# Patient Record
Sex: Male | Born: 2000 | Race: White | Hispanic: No | Marital: Single | Smoking: Never smoker
Health system: Southern US, Community
[De-identification: ages and names within clinical notes are randomized; demographics above are authoritative.]

## PROBLEM LIST (undated history)

## (undated) DIAGNOSIS — R011 Cardiac murmur, unspecified: Secondary | ICD-10-CM

## (undated) DIAGNOSIS — J020 Streptococcal pharyngitis: Secondary | ICD-10-CM

## (undated) DIAGNOSIS — R197 Diarrhea, unspecified: Secondary | ICD-10-CM

## (undated) DIAGNOSIS — R51 Headache: Secondary | ICD-10-CM

## (undated) HISTORY — PX: OTHER SURGICAL HISTORY: SHX169

## (undated) HISTORY — PX: CIRCUMCISION: SUR203

---
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
## 2022-03-27 ENCOUNTER — Ambulatory Visit
Admission: EM | Admit: 2022-03-27 | Discharge: 2022-03-27 | Disposition: A | Discharge status: Home / Self Care | Payer: BC Managed Care – PPO | Attending: Urgent Care | Admitting: Urgent Care

## 2022-03-27 ENCOUNTER — Encounter: Payer: Self-pay | Admitting: Emergency Medicine

## 2022-03-27 DIAGNOSIS — J309 Allergic rhinitis, unspecified: Secondary | ICD-10-CM

## 2022-03-27 DIAGNOSIS — J019 Acute sinusitis, unspecified: Secondary | ICD-10-CM | POA: Diagnosis not present

## 2022-03-27 MED ORDER — PSEUDOEPHEDRINE HCL 60 MG PO TABS: 60.0000 mg | ORAL_TABLET | Freq: Three times a day (TID) | ORAL | 0 refills | Status: DC | PRN

## 2022-03-27 MED ORDER — DOXYCYCLINE HYCLATE 100 MG PO CAPS: 100.0000 mg | ORAL_CAPSULE | Freq: Two times a day (BID) | ORAL | 0 refills | Status: DC

## 2022-03-27 MED ORDER — PREDNISONE 10 MG PO TABS: 30.0000 mg | ORAL_TABLET | Freq: Every day | ORAL | 0 refills | Status: DC

## 2022-03-27 MED ORDER — LEVOCETIRIZINE DIHYDROCHLORIDE 5 MG PO TABS: 5.0000 mg | ORAL_TABLET | Freq: Every evening | ORAL | 0 refills | Status: AC

## 2022-03-27 NOTE — ED Provider Notes (Signed)
?[place] ? ? ?MRN: [id number] DOB: 03-08-2001 ? ?Subjective:  ? ?Alexander Mckinney is a 21 y.o. male presenting for 2-week history of persistent sinus congestion, postnasal drainage, coughing now having throat pain.  Patient has a history of allergic rhinitis, has been using his Flonase without resolution of his symptoms.  No chest pain, fevers, shortness of breath or wheezing.  No history of asthma.  Patient is non-smoker. ? ?No current facility-administered medications for this encounter. ? ?Current Outpatient Medications:  ?  acetaminophen (TYLENOL) 325 MG tablet, Take 2 tablets (650 mg total) by mouth every 6 (six) hours as needed for mild pain., Disp: 15 tablet, Rfl: 0 ?  fluticasone (FLONASE) 50 MCG/ACT nasal spray, Place 1 spray into both nostrils at bedtime., Disp: , Rfl:  ?  HYDROCHLOROTHIAZIDE PO, Take by mouth., Disp: , Rfl:  ?  ibuprofen (ADVIL,MOTRIN) 100 MG/5ML suspension, Take 100 mg by mouth every 6 (six) hours as needed for fever., Disp: , Rfl:  ?  LOSARTAN POTASSIUM PO, Take by mouth., Disp: , Rfl:  ?  Pediatric Multiple Vit-C-FA (PEDIATRIC MULTIVITAMIN) chewable tablet, Chew 1 tablet by mouth daily., Disp: , Rfl:   ? ?Allergies  ?Allergen Reactions  ? Azithromycin Hives  ? ? ?Past Medical History:  ?Diagnosis Date  ? Diarrhea   ? recently with the flu-had flu 10/14/12  ? Headache(784.0)   ? occasionally with flu  ? 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 Strep throat   ?  ? ?Past Surgical History:  ?Procedure Laterality Date  ? CIRCUMCISION    ? drain placed    ? in abdomen to drain infection  ? LAPAROSCOPIC APPENDECTOMY  10/23/2012  ? Procedure: APPENDECTOMY LAPAROSCOPIC;  Surgeon: Jerilynn Mages. Gerald Stabs, MD;  Location: [place];  Service: Pediatrics;  Laterality: N/A;  ? picc line    ? ? ?Family History  ?Problem Relation Age of Onset  ? Hypertension Father   ? Hyperlipidemia Father   ? Hypertension Paternal Uncle   ? Arthritis Maternal Grandmother   ? Birth defects Maternal  Grandmother   ? Cancer Paternal Grandmother   ? Diabetes Paternal Grandfather   ? Heart disease Paternal Grandfather   ? Hypertension Paternal Grandfather   ? Hyperlipidemia Maternal Uncle   ? Hypertension Maternal Uncle   ? Diabetes Maternal Grandfather   ? Hyperlipidemia Maternal Grandfather   ? Hypertension Maternal Grandfather   ? Vision loss Maternal Grandfather   ? ? ?Social History  ? ?Tobacco Use  ? Smoking status: Passive Smoke Exposure - Never Smoker  ? Smokeless tobacco: Never  ?Substance Use Topics  ? Alcohol use: No  ? Drug use: No  ? ? ?ROS ? ? ?Objective:  ? ?Vitals: ?BP (!) 155/94   Pulse 98   Temp 98.4 ?F (36.9 ?C)   Resp 20   SpO2 98%  ? ?Physical Exam ?Constitutional:   ?   General: He is not in acute distress. ?   Appearance: Normal appearance. He is well-developed and normal weight. He is not ill-appearing, toxic-appearing or diaphoretic.  ?HENT:  ?   Head: Normocephalic and atraumatic.  ?   Right Ear: Tympanic membrane, ear canal and external ear normal. There is no impacted cerumen.  ?   Left Ear: Tympanic membrane, ear canal and external ear normal. There is no impacted cerumen.  ?   Nose: Congestion present. No rhinorrhea.  ?   Mouth/Throat:  ?   Mouth: Mucous membranes are moist.  ?  Pharynx: No oropharyngeal exudate or posterior oropharyngeal erythema.  ?   Comments: Cobblestone pattern postnasal drainage overlying pharynx. ?Eyes:  ?   General: No scleral icterus.    ?   Right eye: No discharge.     ?   Left eye: No discharge.  ?   Extraocular Movements: Extraocular movements intact.  ?   Conjunctiva/sclera: Conjunctivae normal.  ?Cardiovascular:  ?   Rate and Rhythm: Normal rate and regular rhythm.  ?   Heart sounds: Normal heart sounds. No murmur heard. ?  No friction rub. No gallop.  ?Pulmonary:  ?   Effort: Pulmonary effort is normal. No respiratory distress.  ?   Breath sounds: Normal breath sounds. No stridor. No wheezing, rhonchi or rales.  ?Musculoskeletal:  ?   Cervical back:  Normal range of motion and neck supple. No rigidity. No muscular tenderness.  ?Neurological:  ?   General: No focal deficit present.  ?   Mental Status: He is alert and oriented to person, place, and time.  ?Psychiatric:     ?   Mood and Affect: Mood normal.     ?   Behavior: Behavior normal.     ?   Thought Content: Thought content normal.  ? ? ?Assessment and Plan :  ? ?PDMP not reviewed this encounter. ? ?1. Acute non-recurrent sinusitis, unspecified location   ?2. Allergic rhinitis, unspecified seasonality, unspecified trigger   ? ? Deferred imaging given clear cardiopulmonary exam, hemodynamically stable vital signs.  Recommended covering for sinusitis with doxycycline given his significant allergies to azithromycin, penicillin and amoxicillin.  We will also treat for the underlying problem, allergic rhinitis with the prednisone course.  Hold Flonase for now.  Start Xyzal daily.  Once prednisone is done, use pseudoephedrine as needed.  Restart Flonase or nasal spray in 1 to 2 weeks once symptoms resolve completely. Counseled patient on potential for adverse effects with medications prescribed/recommended today, ER and return-to-clinic precautions discussed, patient verbalized understanding. ? ?  ?Jaynee Eagles, PA-C ?03/27/22 1244 ? ?

## 2022-03-27 NOTE — ED Triage Notes (Signed)
Pt here with intermittent nasal congestion and cough x 2 weeks.  ?

## 2022-08-11 ENCOUNTER — Ambulatory Visit
Admission: EM | Admit: 2022-08-11 | Discharge: 2022-08-11 | Disposition: A | Discharge status: Home / Self Care | Payer: BC Managed Care – PPO

## 2022-08-11 DIAGNOSIS — L739 Follicular disorder, unspecified: Secondary | ICD-10-CM

## 2022-08-11 NOTE — ED Triage Notes (Signed)
Pt states last night while using a Qtip he noticed white substance and a scant amount  bloody drainage from right ear. The patient states his mom saw a pimple in the ear.

## 2022-08-11 NOTE — ED Provider Notes (Signed)
[facility]    CSN: [id number] Arrival date & time: 08/11/22  1008    HISTORY   Chief Complaint  Patient presents with   Ear Drainage   HPI Alexander Mckinney is a pleasant, 21 y.o. male who presents to urgent care today. Pt states last night while using a Qtip to clean his right ear, he noticed white substance and a scant amount  bloody drainage on the Q-tip. The patient states his mom use a flashlight to take a look, states she told him that she saw a pimple in his ear.   The history is provided by the patient.   Past Medical History:  Diagnosis Date   Diarrhea    recently with the flu-had flu 10/14/12   Headache(784.0)    occasionally with flu   Heart murmur    from birth   Strep throat    Patient Active Problem List   Diagnosis Date Noted   Chronic tonsillar hypertrophy 03/16/2015   Past Surgical History:  Procedure Laterality Date   CIRCUMCISION     drain placed     in abdomen to drain infection   LAPAROSCOPIC APPENDECTOMY  10/23/2012   Procedure: APPENDECTOMY LAPAROSCOPIC;  Surgeon: Judie PetitM. Leonia CoronaShuaib Farooqui, MD;  Location: [facility];  Service: Pediatrics;  Laterality: N/A;   picc line      Home Medications    Prior to Admission medications   Medication Sig Start Date End Date Taking? Authorizing Provider  acetaminophen (TYLENOL) 325 MG tablet Take 2 tablets (650 mg total) by mouth every 6 (six) hours as needed for mild pain. 03/31/15   Marcellina MillinGaley, Timothy, MD  doxycycline (VIBRAMYCIN) 100 MG capsule Take 1 capsule (100 mg total) by mouth 2 (two) times daily. 03/27/22   Wallis BambergMani, Mario, PA-C  fluticasone (FLONASE) 50 MCG/ACT nasal spray Place 1 spray into both nostrils at bedtime.    [provider]  HYDROCHLOROTHIAZIDE PO Take by mouth.    [provider]  ibuprofen (ADVIL,MOTRIN) 100 MG/5ML suspension Take 100 mg by mouth every 6 (six) hours as needed for fever.    [provider]  levocetirizine (XYZAL) 5 MG tablet Take 1 tablet (5 mg total) by  mouth every evening. 03/27/22   Wallis BambergMani, Mario, PA-C  LOSARTAN POTASSIUM PO Take by mouth.    [provider]  Pediatric Multiple Vit-C-FA (PEDIATRIC MULTIVITAMIN) chewable tablet Chew 1 tablet by mouth daily.    [provider]  predniSONE (DELTASONE) 10 MG tablet Take 3 tablets (30 mg total) by mouth daily with breakfast. 03/27/22   Wallis BambergMani, Mario, PA-C  pseudoephedrine (SUDAFED) 60 MG tablet Take 1 tablet (60 mg total) by mouth every 8 (eight) hours as needed for congestion. 03/27/22   Wallis BambergMani, Mario, PA-C    Family History Family History  Problem Relation Age of Onset   Hypertension Father    Hyperlipidemia Father    Hypertension Paternal Uncle    Arthritis Maternal Grandmother    Birth defects Maternal Grandmother    Cancer Paternal Grandmother    Diabetes Paternal Grandfather    Heart disease Paternal Grandfather    Hypertension Paternal Grandfather    Hyperlipidemia Maternal Uncle    Hypertension Maternal Uncle    Diabetes Maternal Grandfather    Hyperlipidemia Maternal Grandfather    Hypertension Maternal Grandfather    Vision loss Maternal Grandfather    Social History Social History   Tobacco Use   Smoking status: Passive Smoke Exposure - Never Smoker   Smokeless tobacco: Never  Substance Use Topics   Alcohol use: No   Drug use: No   Allergies   Amoxicillin, Azithromycin, and Penicillins  Review of Systems Review of Systems Pertinent findings revealed after performing a 14 point review of systems has been noted in the history of present illness.  Physical Exam Triage Vital Signs ED Triage Vitals  Enc Vitals Group     BP 09/16/21 0827 (!) 147/82     Pulse Rate 09/16/21 0827 72     Resp 09/16/21 0827 18     Temp 09/16/21 0827 98.3 F (36.8 C)     Temp Source 09/16/21 0827 Oral     SpO2 09/16/21 0827 98 %     Weight --      Height --      Head Circumference --      Peak Flow --      Pain Score 09/16/21 0826 5     Pain Loc --      Pain Edu? --       Excl. in GC? --   No data found.  Updated Vital Signs BP (!) 150/87 (BP Location: Right Arm)   Pulse 72   Temp 98.4 F (36.9 C) (Oral)   Resp 16   SpO2 97%   Physical Exam Vitals and nursing note reviewed.  Constitutional:      General: He is not in acute distress.    Appearance: Normal appearance. He is not ill-appearing.  HENT:     Head: Normocephalic and atraumatic.     Salivary Glands: Right salivary gland is not diffusely enlarged or tender. Left salivary gland is not diffusely enlarged or tender.     Right Ear: Hearing, tympanic membrane and external ear normal. No drainage. No middle ear effusion. There is no impacted cerumen. Tympanic membrane is not erythematous or bulging.     Left Ear: Hearing, tympanic membrane, ear canal and external ear normal. No drainage.  No middle ear effusion. There is no impacted cerumen. Tympanic membrane is not erythematous or bulging.     Ears:     Comments: Scant amount of bleeding at entrance of right ear canal    Nose: Nose normal. No nasal deformity, septal deviation, mucosal edema, congestion or rhinorrhea.     Right Turbinates: Not enlarged, swollen or pale.     Left Turbinates: Not enlarged, swollen or pale.     Right Sinus: No maxillary sinus tenderness or frontal sinus tenderness.     Left Sinus: No maxillary sinus tenderness or frontal sinus tenderness.     Mouth/Throat:     Lips: Pink. No lesions.     Mouth: Mucous membranes are moist. No oral lesions.     Pharynx: Oropharynx is clear. Uvula midline. No posterior oropharyngeal erythema or uvula swelling.     Tonsils: No tonsillar exudate. 0 on the right. 0 on the left.  Eyes:     General: Lids are normal.        Right eye: No discharge.        Left eye: No discharge.     Extraocular Movements: Extraocular movements intact.     Conjunctiva/sclera: Conjunctivae normal.     Right eye: Right conjunctiva is not injected.     Left eye: Left conjunctiva is not injected.  Neck:      Trachea: Trachea and phonation normal.  Cardiovascular:     Rate and Rhythm: Normal rate and regular rhythm.     Pulses: Normal pulses.  Heart sounds: Normal heart sounds. No murmur heard.    No friction rub. No gallop.  Pulmonary:     Effort: Pulmonary effort is normal. No accessory muscle usage, prolonged expiration or respiratory distress.     Breath sounds: Normal breath sounds. No stridor, decreased air movement or transmitted upper airway sounds. No decreased breath sounds, wheezing, rhonchi or rales.  Chest:     Chest wall: No tenderness.  Musculoskeletal:        General: Normal range of motion.     Cervical back: Normal range of motion and neck supple. Normal range of motion.  Lymphadenopathy:     Cervical: No cervical adenopathy.  Skin:    General: Skin is warm and dry.     Findings: No erythema or rash.  Neurological:     General: No focal deficit present.     Mental Status: He is alert and oriented to person, place, and time.  Psychiatric:        Mood and Affect: Mood normal.        Behavior: Behavior normal.     Visual Acuity Right Eye Distance:   Left Eye Distance:   Bilateral Distance:    Right Eye Near:   Left Eye Near:    Bilateral Near:     UC Couse / Diagnostics / Procedures:     Radiology No results found.  Procedures Procedures (including critical care time) EKG  Pending results:  Labs Reviewed - No data to display  Medications Ordered in UC: Medications - No data to display  UC Diagnoses / Final Clinical Impressions(s)   I have reviewed the triage vital signs and the nursing notes.  Pertinent labs & imaging results that were available during my care of the patient were reviewed by me and considered in my medical decision making (see chart for details).    Final diagnoses:  Folliculitis   Patient advised to not use any earplugs for a few days until the lesion heals.  ED Prescriptions   None    PDMP not reviewed this  encounter.  Disposition Upon Discharge:  Condition: stable for discharge home Home: take medications as prescribed; routine discharge instructions as discussed; follow up as advised.  Patient presented with an acute illness with associated systemic symptoms and significant discomfort requiring urgent management. In my opinion, this is a condition that a prudent lay person (someone who possesses an average knowledge of health and medicine) may potentially expect to result in complications if not addressed urgently such as respiratory distress, impairment of bodily function or dysfunction of bodily organs.   Routine symptom specific, illness specific and/or disease specific instructions were discussed with the patient and/or caregiver at length.   As such, the patient has been evaluated and assessed, work-up was performed and treatment was provided in alignment with urgent care protocols and evidence based medicine.  Patient/parent/caregiver has been advised that the patient may require follow up for further testing and treatment if the symptoms continue in spite of treatment, as clinically indicated and appropriate.  If the patient was tested for COVID-19, Influenza and/or RSV, then the patient/parent/guardian was advised to isolate at home pending the results of his/her diagnostic coronavirus test and potentially longer if they're positive. I have also advised pt that if his/her COVID-19 test returns positive, it's recommended to self-isolate for at least 10 days after symptoms first appeared AND until fever-free for 24 hours without fever reducer AND other symptoms have improved or resolved. Discussed self-isolation recommendations as  well as instructions for household member/close contacts as per the CDC and [place] DHHS, and also gave patient the [place] packet with this information.  Patient/parent/caregiver has been advised to return to the [facility] or PCP in 3-5 days if no better; to PCP or the Emergency  Department if new signs and symptoms develop, or if the current signs or symptoms continue to change or worsen for further workup, evaluation and treatment as clinically indicated and appropriate  The patient will follow up with their current PCP if and as advised. If the patient does not currently have a PCP we will assist them in obtaining one.   The patient may need specialty follow up if the symptoms continue, in spite of conservative treatment and management, for further workup, evaluation, consultation and treatment as clinically indicated and appropriate.  Patient/parent/caregiver verbalized understanding and agreement of plan as discussed.  All questions were addressed during visit.  Please see discharge instructions below for further details of plan.  Discharge Instructions:   Discharge Instructions      Please avoid using any earplugs in your ear canals till the lesion in your right ear canal has healed.      This office note has been dictated using Museum/gallery curator.  Unfortunately, this method of dictation can sometimes lead to typographical or grammatical errors.  I apologize for your inconvenience in advance if this occurs.  Please do not hesitate to reach out to me if clarification is needed.      Lynden Oxford Scales, PA-C 08/11/22 1154

## 2022-08-11 NOTE — Discharge Instructions (Signed)
Please avoid using any earplugs in your ear canals till the lesion in your right ear canal has healed.

## 2022-10-24 ENCOUNTER — Ambulatory Visit
Admission: EM | Admit: 2022-10-24 | Discharge: 2022-10-24 | Disposition: A | Discharge status: Home / Self Care | Payer: BC Managed Care – PPO | Attending: Urgent Care | Admitting: Urgent Care

## 2022-10-24 DIAGNOSIS — R0982 Postnasal drip: Secondary | ICD-10-CM

## 2022-10-24 DIAGNOSIS — J309 Allergic rhinitis, unspecified: Secondary | ICD-10-CM | POA: Diagnosis not present

## 2022-10-24 DIAGNOSIS — J988 Other specified respiratory disorders: Secondary | ICD-10-CM

## 2022-10-24 DIAGNOSIS — B9789 Other viral agents as the cause of diseases classified elsewhere: Secondary | ICD-10-CM | POA: Diagnosis not present

## 2022-10-24 MED ORDER — CETIRIZINE HCL 10 MG PO TABS: 10.0000 mg | ORAL_TABLET | Freq: Every day | ORAL | 0 refills | Status: AC

## 2022-10-24 MED ORDER — PSEUDOEPHEDRINE HCL 60 MG PO TABS: 60.0000 mg | ORAL_TABLET | Freq: Three times a day (TID) | ORAL | 0 refills | Status: AC | PRN

## 2022-10-24 MED ORDER — IPRATROPIUM BROMIDE 0.03 % NA SOLN: 2.0000 | Freq: Two times a day (BID) | NASAL | 0 refills | Status: AC

## 2022-10-24 NOTE — Discharge Instructions (Addendum)
We will manage this as a viral illness. For sore throat or cough try using a honey-based tea. Use 3 teaspoons of honey with juice squeezed from half lemon. Place shaved pieces of ginger into 1/2-1 cup of water and warm over stove top. Then mix the ingredients and repeat every 4 hours as needed. Please take ibuprofen 600mg  every 6 hours with food alternating with OR taken together with Tylenol 500mg -650mg  every 6 hours for throat pain, fevers, aches and pains. Hydrate very well with at least 2 liters of water. Eat light meals such as soups (chicken and noodles, vegetable, chicken and wild rice).  Do not eat foods that you are allergic to.  Taking an antihistamine like Zyrtec can help against postnasal drainage, sinus congestion which can cause sinus pain, sinus headaches, throat pain, painful swallowing, coughing.  You can take this together with pseudoephedrine (Sudafed) at a dose of 60 mg 3 times a day or twice daily as needed for the same kind of nasal drip, congestion.  However, limit your use of pseudoephedrine if you have high blood pressure or avoid altogether if you have abnormal heart rhythms, heart condition.

## 2022-10-24 NOTE — ED Triage Notes (Signed)
Pt c/o nasal congestion started yesterday-NAD-steady gait

## 2022-10-24 NOTE — ED Provider Notes (Signed)
[facility]  Note:  This document was prepared using Conservation officer, historic buildings and may include unintentional dictation errors.  MRN: [id number] DOB: 18-Sep-2001  Subjective:   Alexander Mckinney is a 21 y.o. male presenting for 1 day history of acute onset sinus congestion, post-nasal drainage. Has a history of recurrent sinus infections. Has had strep throat infections, history of tonsillectomy. No concerns for strep or COVID, patient declines testing. Has a history of allergic rhinitis.   No current facility-administered medications for this encounter.  Current Outpatient Medications:    acetaminophen (TYLENOL) 325 MG tablet, Take 2 tablets (650 mg total) by mouth every 6 (six) hours as needed for mild pain., Disp: 15 tablet, Rfl: 0   fluticasone (FLONASE) 50 MCG/ACT nasal spray, Place 1 spray into both nostrils at bedtime., Disp: , Rfl:    ibuprofen (ADVIL,MOTRIN) 100 MG/5ML suspension, Take 100 mg by mouth every 6 (six) hours as needed for fever., Disp: , Rfl:    levocetirizine (XYZAL) 5 MG tablet, Take 1 tablet (5 mg total) by mouth every evening., Disp: 90 tablet, Rfl: 0   Pediatric Multiple Vit-C-FA (PEDIATRIC MULTIVITAMIN) chewable tablet, Chew 1 tablet by mouth daily., Disp: , Rfl:    Allergies  Allergen Reactions   Amoxicillin Rash   Azithromycin Hives   Penicillins Hives    Past Medical History:  Diagnosis Date   Diarrhea    recently with the flu-had flu 10/14/12   Headache(784.0)    occasionally with flu   Heart murmur    from birth   Strep throat      Past Surgical History:  Procedure Laterality Date   CIRCUMCISION     drain placed     in abdomen to drain infection   LAPAROSCOPIC APPENDECTOMY  10/23/2012   Procedure: APPENDECTOMY LAPAROSCOPIC;  Surgeon: Judie Petit. Leonia Corona, MD;  Location: [facility];  Service: Pediatrics;  Laterality: N/A;   picc line      Family History  Problem Relation Age of Onset   Hypertension Father     Hyperlipidemia Father    Hypertension Paternal Uncle    Arthritis Maternal Grandmother    Birth defects Maternal Grandmother    Cancer Paternal Grandmother    Diabetes Paternal Grandfather    Heart disease Paternal Grandfather    Hypertension Paternal Grandfather    Hyperlipidemia Maternal Uncle    Hypertension Maternal Uncle    Diabetes Maternal Grandfather    Hyperlipidemia Maternal Grandfather    Hypertension Maternal Grandfather    Vision loss Maternal Grandfather     Social History   Tobacco Use   Smoking status: Never   Smokeless tobacco: Never  Substance Use Topics   Alcohol use: No   Drug use: No    ROS   Objective:   Vitals: BP (!) 156/82 (BP Location: Right Arm)   Pulse 82   Temp 98.6 F (37 C) (Oral)   Resp 18   SpO2 98%   Physical Exam Constitutional:      General: He is not in acute distress.    Appearance: Normal appearance. He is normal weight. He is not ill-appearing, toxic-appearing or diaphoretic.  HENT:     Head: Normocephalic and atraumatic.     Right Ear: Tympanic membrane, ear canal and external ear normal. No drainage, swelling or tenderness. No middle ear effusion. There is no impacted cerumen. Tympanic membrane is not erythematous or bulging.     Left Ear: Tympanic membrane, ear canal and external ear  normal. No drainage, swelling or tenderness.  No middle ear effusion. There is no impacted cerumen. Tympanic membrane is not erythematous or bulging.     Nose: Congestion present. No rhinorrhea.     Mouth/Throat:     Mouth: Mucous membranes are moist.     Pharynx: No oropharyngeal exudate or posterior oropharyngeal erythema.     Comments: Cobblestone pattern post-nasal drainage overlying pharynx.  Eyes:     General: No scleral icterus.       Right eye: No discharge.        Left eye: No discharge.     Extraocular Movements: Extraocular movements intact.     Conjunctiva/sclera: Conjunctivae normal.  Cardiovascular:     Rate and Rhythm:  Normal rate.  Pulmonary:     Effort: Pulmonary effort is normal.  Musculoskeletal:     Cervical back: Normal range of motion and neck supple. No rigidity. No muscular tenderness.  Neurological:     General: No focal deficit present.     Mental Status: He is alert and oriented to person, place, and time.  Psychiatric:        Mood and Affect: Mood normal.        Behavior: Behavior normal.     Assessment and Plan :   PDMP not reviewed this encounter.  1. Viral respiratory illness   2. Post-nasal drainage   3. Allergic rhinitis, unspecified seasonality, unspecified trigger     Patient declined testing. Discussed antibiotic stewardship. Given one day of symptoms will hold off on antibiotic use. Recommended supportive care. Counseled patient on potential for adverse effects with medications prescribed/recommended today, ER and return-to-clinic precautions discussed, patient verbalized understanding.    Wallis Bamberg, PA-C 10/24/22 2206

## 2022-12-12 ENCOUNTER — Emergency Department (HOSPITAL_BASED_OUTPATIENT_CLINIC_OR_DEPARTMENT_OTHER): Payer: Self-pay

## 2022-12-12 ENCOUNTER — Other Ambulatory Visit: Payer: Self-pay

## 2022-12-12 ENCOUNTER — Emergency Department (HOSPITAL_BASED_OUTPATIENT_CLINIC_OR_DEPARTMENT_OTHER)
Admission: EM | Admit: 2022-12-12 | Discharge: 2022-12-12 | Disposition: A | Discharge status: Home / Self Care | Payer: Worker's Compensation | Attending: Emergency Medicine | Admitting: Emergency Medicine

## 2022-12-12 DIAGNOSIS — W241XXA Contact with transmission devices, not elsewhere classified, initial encounter: Secondary | ICD-10-CM | POA: Diagnosis not present

## 2022-12-12 DIAGNOSIS — S61300A Unspecified open wound of right index finger with damage to nail, initial encounter: Secondary | ICD-10-CM | POA: Insufficient documentation

## 2022-12-12 DIAGNOSIS — Y990 Civilian activity done for income or pay: Secondary | ICD-10-CM | POA: Diagnosis not present

## 2022-12-12 DIAGNOSIS — Z23 Encounter for immunization: Secondary | ICD-10-CM | POA: Insufficient documentation

## 2022-12-12 DIAGNOSIS — S6991XA Unspecified injury of right wrist, hand and finger(s), initial encounter: Secondary | ICD-10-CM | POA: Diagnosis present

## 2022-12-12 DIAGNOSIS — S61209A Unspecified open wound of unspecified finger without damage to nail, initial encounter: Secondary | ICD-10-CM

## 2022-12-12 MED ORDER — TETANUS-DIPHTH-ACELL PERTUSSIS 5-2.5-18.5 LF-MCG/0.5 IM SUSY
0.5000 mL | PREFILLED_SYRINGE | Freq: Once | INTRAMUSCULAR | Status: AC
  Administered 2022-12-12: 0.5 mL via INTRAMUSCULAR
  Filled 2022-12-12: qty 0.5

## 2022-12-12 MED ORDER — CEPHALEXIN 500 MG PO CAPS: 500.0000 mg | ORAL_CAPSULE | Freq: Four times a day (QID) | ORAL | 0 refills | Status: AC

## 2022-12-12 NOTE — ED Provider Notes (Signed)
[facility] Provider Note   CSN: [id number] Arrival date & time: 12/12/22  1731     History  Chief Complaint  Patient presents with   Finger Laceration    Alexander Mckinney is a 22 y.o. male presents to the ED with an injury to the right anterior index finger that occurred while he was at work earlier today.  Patient states that he got his finger stuck in a conveyor belt which ripped some of the skin on his finger.  He was able to control the bleeding himself shortly after time of injury.  He does not take blood thinners.  Unknown when last tetanus shot was.  Denies any other injury from the incident.  Reports some numbness to the underside of his finger and limited movement due to swelling and pain.      Home Medications Prior to Admission medications   Medication Sig Start Date End Date Taking? Authorizing Provider  cephALEXin (KEFLEX) 500 MG capsule Take 1 capsule (500 mg total) by mouth 4 (four) times daily. 12/12/22  Yes Daniah Zaldivar R, PA  acetaminophen (TYLENOL) 325 MG tablet Take 2 tablets (650 mg total) by mouth every 6 (six) hours as needed for mild pain. 03/31/15   Isaac Bliss, MD  cetirizine (ZYRTEC ALLERGY) 10 MG tablet Take 1 tablet (10 mg total) by mouth daily. 10/24/22   Jaynee Eagles, PA-C  fluticasone (FLONASE) 50 MCG/ACT nasal spray Place 1 spray into both nostrils at bedtime.    [provider]  ibuprofen (ADVIL,MOTRIN) 100 MG/5ML suspension Take 100 mg by mouth every 6 (six) hours as needed for fever.    [provider]  ipratropium (ATROVENT) 0.03 % nasal spray Place 2 sprays into both nostrils 2 (two) times daily. 10/24/22   Jaynee Eagles, PA-C  levocetirizine (XYZAL) 5 MG tablet Take 1 tablet (5 mg total) by mouth every evening. 03/27/22   Jaynee Eagles, PA-C  Pediatric Multiple Vit-C-FA (PEDIATRIC MULTIVITAMIN) chewable tablet Chew 1 tablet by mouth daily.    [provider]  pseudoephedrine (SUDAFED)  60 MG tablet Take 1 tablet (60 mg total) by mouth every 8 (eight) hours as needed for congestion. 10/24/22   Jaynee Eagles, PA-C      Allergies    Amoxicillin, Azithromycin, and Penicillins    Review of Systems   Review of Systems  Musculoskeletal:  Positive for joint swelling.  Skin:  Positive for wound.  Neurological:  Positive for numbness (palmar side of R index finger). Negative for weakness.    Physical Exam Updated Vital Signs BP (!) 162/87 (BP Location: Left Arm)   Pulse 96   Temp 98.6 F (37 C) (Oral)   Resp 20   Ht 5\' 9"  (1.753 m)   Wt 88.5 kg   SpO2 100%   BMI 28.80 kg/m  Physical Exam Vitals and nursing note reviewed.  Constitutional:      General: He is not in acute distress.    Appearance: Normal appearance. He is not ill-appearing or diaphoretic.  Pulmonary:     Effort: Pulmonary effort is normal.  Musculoskeletal:     Right hand: Tenderness and bony tenderness present. Decreased range of motion. Decreased sensation. Normal capillary refill. Normal pulse.     Comments: Partial avulsion to the dorsal surface of the right index finger with no active bleeding.  Tissue appears to be somewhat macerated due to mechanism of action of injury.  He does have limited range of motion due to  swelling and pain.  Has numbness to the palmar aspect over the DIP joint.  Radial pulse is 2+.  Neurological:     Mental Status: He is alert. Mental status is at baseline.  Psychiatric:        Mood and Affect: Mood normal.        Behavior: Behavior normal.     ED Results / Procedures / Treatments   Labs (all labs ordered are listed, but only abnormal results are displayed) Labs Reviewed - No data to display  EKG None  Radiology DG Finger Index Right  Result Date: 12/12/2022 CLINICAL DATA:  Laceration EXAM: RIGHT INDEX FINGER 2+V COMPARISON:  None Available. FINDINGS: There is soft tissue laceration overlying the dorsal aspect of the distal second finger. There is no radiopaque  foreign body. There is no evidence of fracture or dislocation. There is no evidence of arthropathy or other focal bone abnormality. IMPRESSION: Soft tissue laceration overlying the dorsal aspect of the distal second finger. No radiopaque foreign body. No acute osseous abnormality. Electronically Signed   By: Ronney Asters M.D.   On: 12/12/2022 19:16    Procedures Procedures    Medications Ordered in ED Medications  Tdap (BOOSTRIX) injection 0.5 mL (has no administration in time range)    ED Course/ Medical Decision Making/ A&P                             Medical Decision Making Amount and/or Complexity of Data Reviewed Radiology: ordered.  Risk Prescription drug management.   Patient presents to the ED with an injury to his right index finger that occurred at work.  Patient got his finger caught in the conveyor belt which caused a partial avulsion of tissue on the dorsal aspect of the right index finger.  He reports he was able to control the bleeding at the time of the injury.  Unknown when his last tetanus shot was.  Exam significant for partial avulsion to the dorsal surface of the right index finger with no active bleeding.  Tissue appears to be somewhat macerated due to mechanism of action of injury.  He does have limited range of motion due to swelling and pain.  He has numbness to the palmar aspect of the DIP joint.  Radial pulses 2+.  I ordered and personally interpreted imaging of the right hand which showed no osseous abnormality or fracture.  There is soft tissue laceration visible on x-ray.  No retained foreign body.   Due to the nature of the injury and maceration of tissue, laceration repair is not an option at this time.  Will thoroughly irrigate the area and place a Xeroform bandage on it with wrapping.  Discussed supportive care measures at home for infection prevention with the patient including cleaning with soapy warm water daily.  Will update patient's tetanus shot  today.  Will also send antibiotic prophylaxis to pharmacy.  The patient has been appropriately medically screened and/or stabilized in the ED. I have low suspicion for any other emergent medical condition which would require further screening, evaluation or treatment in the ED or require inpatient management. At time of discharge the patient is hemodynamically stable and in no acute distress. I have discussed work-up results and diagnosis with patient and answered all questions. Patient is agreeable with discharge plan. We discussed strict return precautions for returning to the emergency department and they verbalized understanding.  Final Clinical Impression(s) / ED Diagnoses Final diagnoses:  Avulsion of finger tip, initial encounter    Rx / DC Orders ED Discharge Orders          Ordered    cephALEXin (KEFLEX) 500 MG capsule  4 times daily        12/12/22 2159              Lenard Simmer, [place] 12/12/22 2207    Ernie Avena, MD 12/13/22 1844

## 2022-12-12 NOTE — Discharge Instructions (Addendum)
Thank you for allowing me to be part of your care today.  Your x-ray was negative for broken bone.  You have an avulsion to your finger and this skin may heal or fall off and then heal by second intention.  It is important to keep this area clean.  Clean with warm soapy water daily and put a new bandage on it.  I have sent an antibiotic prescription to your pharmacy.  Monitor your injury for signs of infection including redness, swelling, pus or other discharge from the injury site.   I recommend taking ibuprofen as needed for pain and swelling to your finger.  I recommend follow-up with your primary care provider should you develop any signs of infection or have any new concerns.

## 2022-12-12 NOTE — ED Triage Notes (Addendum)
Pt c/o laceration to R anterior index finger.  Pt reports getting finger stuck in a conveyor belt.  Pain score 7/10.  Last tetanus unknown.
